# Patient Record
Sex: Female | Born: 1975 | Race: White | Hispanic: No | Marital: Single | Smoking: Current every day smoker
Health system: Southern US, Community
[De-identification: ages and names within clinical notes are randomized; demographics above are authoritative.]

## PROBLEM LIST (undated history)

## (undated) DIAGNOSIS — F319 Bipolar disorder, unspecified: Secondary | ICD-10-CM

## (undated) DIAGNOSIS — M1990 Unspecified osteoarthritis, unspecified site: Secondary | ICD-10-CM

## (undated) HISTORY — PX: BACK SURGERY: SHX140

---

## 2007-04-14 ENCOUNTER — Encounter
Admission: RE | Admit: 2007-04-14 | Discharge: 2007-04-16 | Payer: Self-pay | Admitting: Physical Medicine and Rehabilitation

## 2013-01-05 ENCOUNTER — Encounter (HOSPITAL_COMMUNITY): Payer: Self-pay | Admitting: Emergency Medicine

## 2013-01-05 ENCOUNTER — Emergency Department (HOSPITAL_COMMUNITY)
Admission: EM | Admit: 2013-01-05 | Discharge: 2013-01-05 | Disposition: A | Discharge status: Home / Self Care | Payer: Medicaid Other | Attending: Emergency Medicine | Admitting: Emergency Medicine

## 2013-01-05 DIAGNOSIS — Z9889 Other specified postprocedural states: Secondary | ICD-10-CM | POA: Insufficient documentation

## 2013-01-05 DIAGNOSIS — Z9181 History of falling: Secondary | ICD-10-CM | POA: Insufficient documentation

## 2013-01-05 DIAGNOSIS — F17200 Nicotine dependence, unspecified, uncomplicated: Secondary | ICD-10-CM | POA: Insufficient documentation

## 2013-01-05 DIAGNOSIS — K089 Disorder of teeth and supporting structures, unspecified: Secondary | ICD-10-CM | POA: Insufficient documentation

## 2013-01-05 DIAGNOSIS — M5380 Other specified dorsopathies, site unspecified: Secondary | ICD-10-CM | POA: Insufficient documentation

## 2013-01-05 DIAGNOSIS — M549 Dorsalgia, unspecified: Secondary | ICD-10-CM

## 2013-01-05 DIAGNOSIS — Z880 Allergy status to penicillin: Secondary | ICD-10-CM | POA: Insufficient documentation

## 2013-01-05 DIAGNOSIS — M545 Low back pain, unspecified: Secondary | ICD-10-CM | POA: Insufficient documentation

## 2013-01-05 DIAGNOSIS — R52 Pain, unspecified: Secondary | ICD-10-CM | POA: Insufficient documentation

## 2013-01-05 DIAGNOSIS — G8929 Other chronic pain: Secondary | ICD-10-CM | POA: Insufficient documentation

## 2013-01-05 DIAGNOSIS — K0889 Other specified disorders of teeth and supporting structures: Secondary | ICD-10-CM

## 2013-01-05 MED ORDER — CLINDAMYCIN HCL 150 MG PO CAPS: ORAL_CAPSULE | ORAL | Status: DC

## 2013-01-05 MED ORDER — DEXAMETHASONE 6 MG PO TABS: ORAL_TABLET | ORAL | Status: DC

## 2013-01-05 MED ORDER — METHOCARBAMOL 500 MG PO TABS: 500.0000 mg | ORAL_TABLET | Freq: Three times a day (TID) | ORAL | Status: DC

## 2013-01-05 MED ORDER — CLINDAMYCIN HCL 150 MG PO CAPS
300.0000 mg | ORAL_CAPSULE | Freq: Once | ORAL | Status: AC
  Administered 2013-01-05: 300 mg via ORAL
  Filled 2013-01-05: qty 2

## 2013-01-05 MED ORDER — METHOCARBAMOL 500 MG PO TABS
1000.0000 mg | ORAL_TABLET | Freq: Once | ORAL | Status: AC
  Administered 2013-01-05: 1000 mg via ORAL
  Filled 2013-01-05: qty 2

## 2013-01-05 MED ORDER — PROMETHAZINE HCL 12.5 MG PO TABS
12.5000 mg | ORAL_TABLET | Freq: Once | ORAL | Status: AC
  Administered 2013-01-05: 12.5 mg via ORAL
  Filled 2013-01-05: qty 1

## 2013-01-05 MED ORDER — HYDROCODONE-ACETAMINOPHEN 5-325 MG PO TABS: 1.0000 | ORAL_TABLET | ORAL | Status: DC | PRN

## 2013-01-05 MED ORDER — HYDROCODONE-ACETAMINOPHEN 5-325 MG PO TABS
2.0000 | ORAL_TABLET | Freq: Once | ORAL | Status: AC
  Administered 2013-01-05: 2 via ORAL
  Filled 2013-01-05: qty 2

## 2013-01-05 NOTE — ED Notes (Signed)
Hobson PA at bedside,  

## 2013-01-05 NOTE — ED Notes (Signed)
Pain assessed as noted in triage assessment.  Broken L bottom, last molar w/carrie.

## 2013-01-05 NOTE — ED Provider Notes (Signed)
History     CSN: [id number]  Arrival date & time 01/05/13  0945   First MD Initiated Contact with Patient 01/05/13 [phone]      Chief Complaint  Patient presents with  . Back Pain  . Dental Pain    (Consider location/radiation/quality/duration/timing/severity/associated sxs/prior treatment) HPI Comments: Pt states she has cavities of the upper and lower jaw. She had one to break recently and has had pain that is increasing for the last 3 days. She states she can not see a dentist for the next 2 weeks.  Patient is a 37 y.o. female presenting with back pain and tooth pain. The history is provided by the patient.  Back Pain Location:  Lumbar spine Quality:  Aching and cramping Radiates to:  L thigh Pain severity:  Severe Pain is:  Same all the time Onset quality:  Sudden Duration:  5 days Timing:  Constant Progression:  Worsening Chronicity: acute on chronic back pain. Context comment:  Hx of multiple operations involving the back. Larey Seat about 1 week ago. Can not see PCP for 2 weeks. Relieved by:  Nothing Worsened by:  Ambulation and palpation Ineffective treatments:  NSAIDs Associated symptoms: no abdominal pain, no bladder incontinence, no bowel incontinence, no chest pain, no dysuria and no perianal numbness   Dental Pain Associated symptoms: no neck pain     History reviewed. No pertinent past medical history.  Past Surgical History  Procedure Laterality Date  . Back surgery      No family history on file.  History  Substance Use Topics  . Smoking status: Current Every Day Smoker -- 1.00 packs/day    Types: Cigarettes  . Smokeless tobacco: Not on file  . Alcohol Use: No    OB History   Grav Para Term Preterm Abortions TAB SAB Ect Mult Living                  Review of Systems  Constitutional: Negative for activity change.       All ROS Neg except as noted in HPI  HENT: Positive for dental problem. Negative for nosebleeds and neck pain.   Eyes: Negative  for photophobia and discharge.  Respiratory: Negative for cough, shortness of breath and wheezing.   Cardiovascular: Negative for chest pain and palpitations.  Gastrointestinal: Negative for abdominal pain, blood in stool and bowel incontinence.  Genitourinary: Negative for bladder incontinence, dysuria, frequency and hematuria.  Musculoskeletal: Positive for back pain. Negative for arthralgias.  Skin: Negative.   Neurological: Negative for dizziness, seizures and speech difficulty.  Psychiatric/Behavioral: Negative for hallucinations and confusion.    Allergies  Penicillins  Home Medications   Current Outpatient Rx  Name  Route  Sig  Dispense  Refill  . benzocaine (ORAJEL) 10 % mucosal gel   Mouth/Throat   Use as directed 1 application in the mouth or throat as needed for pain.         . naproxen sodium (ALEVE) 220 MG tablet   Oral   Take 220 mg by mouth 3 (three) times daily as needed (back pain).           BP 139/88  Pulse 102  Temp(Src) 98.2 F (36.8 C)  Resp 18  Ht 5\' 4"  (1.626 m)  Wt 150 lb (68.04 kg)  BMI 25.73 kg/m2  SpO2 100%  LMP 01/05/2013  Physical Exam  Nursing note and vitals reviewed. Constitutional: She is oriented to person, place, and time. She appears well-developed and well-nourished.  Non-toxic appearance.  HENT:  Head: Normocephalic.  Right Ear: Tympanic membrane and external ear normal.  Left Ear: Tympanic membrane and external ear normal.  Eyes: EOM and lids are normal. Pupils are equal, round, and reactive to light.  Neck: Normal range of motion. Neck supple. Carotid bruit is not present.  Cardiovascular: Normal rate, regular rhythm, normal heart sounds, intact distal pulses and normal pulses.   Pulmonary/Chest: Breath sounds normal. No respiratory distress.  Abdominal: Soft. Bowel sounds are normal. There is no tenderness. There is no guarding.  Musculoskeletal:       Lumbar back: She exhibits decreased range of motion, tenderness and  spasm.  Lymphadenopathy:       Head (right side): No submandibular adenopathy present.       Head (left side): No submandibular adenopathy present.    She has no cervical adenopathy.  Neurological: She is alert and oriented to person, place, and time. She has normal strength. No cranial nerve deficit or sensory deficit.  Skin: Skin is warm and dry.  Psychiatric: She has a normal mood and affect. Her speech is normal.    ED Course  Procedures (including critical care time)  Labs Reviewed - No data to display No results found.   No diagnosis found.    MDM  I have reviewed nursing notes, vital signs, and all appropriate lab and imaging results for this patient. Ambulated pt in the hall. No foot drop. Exam reveals tenderness and spasm of the lower back area. No palpable step off. No hx of bowel or bladder  Problem. Plan - Rx for clindamycin, norco, decadron, and baclofen given to the patient. Pt strongly encouraged to see her dentist as soon as possible, and to see the back surgeon that performed the surg as soon as possible.       Kathie Dike, PA-C 01/05/13 1118

## 2013-01-05 NOTE — ED Notes (Signed)
Pt c/o back pain after falling while moving x 1 week ago. Pt also c/o right lower dental pain x 3 days from broken tooth.

## 2013-01-06 NOTE — ED Provider Notes (Signed)
Medical screening examination/treatment/procedure(s) were performed by non-physician practitioner and as supervising physician I was immediately available for consultation/collaboration.  Donnetta Hutching, MD 01/06/13 1343

## 2013-03-10 ENCOUNTER — Emergency Department (HOSPITAL_COMMUNITY): Payer: Medicaid Other

## 2013-03-10 ENCOUNTER — Encounter (HOSPITAL_COMMUNITY): Payer: Self-pay | Admitting: *Deleted

## 2013-03-10 ENCOUNTER — Emergency Department (HOSPITAL_COMMUNITY)
Admission: EM | Admit: 2013-03-10 | Discharge: 2013-03-10 | Disposition: A | Discharge status: Home / Self Care | Payer: Medicaid Other | Attending: Emergency Medicine | Admitting: Emergency Medicine

## 2013-03-10 DIAGNOSIS — Z8659 Personal history of other mental and behavioral disorders: Secondary | ICD-10-CM | POA: Insufficient documentation

## 2013-03-10 DIAGNOSIS — F17200 Nicotine dependence, unspecified, uncomplicated: Secondary | ICD-10-CM | POA: Insufficient documentation

## 2013-03-10 DIAGNOSIS — W108XXA Fall (on) (from) other stairs and steps, initial encounter: Secondary | ICD-10-CM | POA: Insufficient documentation

## 2013-03-10 DIAGNOSIS — Z3202 Encounter for pregnancy test, result negative: Secondary | ICD-10-CM | POA: Insufficient documentation

## 2013-03-10 DIAGNOSIS — R21 Rash and other nonspecific skin eruption: Secondary | ICD-10-CM | POA: Insufficient documentation

## 2013-03-10 DIAGNOSIS — Y9289 Other specified places as the place of occurrence of the external cause: Secondary | ICD-10-CM | POA: Insufficient documentation

## 2013-03-10 DIAGNOSIS — Z87828 Personal history of other (healed) physical injury and trauma: Secondary | ICD-10-CM | POA: Insufficient documentation

## 2013-03-10 DIAGNOSIS — W010XXA Fall on same level from slipping, tripping and stumbling without subsequent striking against object, initial encounter: Secondary | ICD-10-CM | POA: Insufficient documentation

## 2013-03-10 DIAGNOSIS — Y9301 Activity, walking, marching and hiking: Secondary | ICD-10-CM | POA: Insufficient documentation

## 2013-03-10 DIAGNOSIS — IMO0002 Reserved for concepts with insufficient information to code with codable children: Secondary | ICD-10-CM | POA: Insufficient documentation

## 2013-03-10 DIAGNOSIS — Z791 Long term (current) use of non-steroidal anti-inflammatories (NSAID): Secondary | ICD-10-CM | POA: Insufficient documentation

## 2013-03-10 DIAGNOSIS — Z880 Allergy status to penicillin: Secondary | ICD-10-CM | POA: Insufficient documentation

## 2013-03-10 DIAGNOSIS — T148XXA Other injury of unspecified body region, initial encounter: Secondary | ICD-10-CM

## 2013-03-10 DIAGNOSIS — G8929 Other chronic pain: Secondary | ICD-10-CM | POA: Insufficient documentation

## 2013-03-10 DIAGNOSIS — S20229A Contusion of unspecified back wall of thorax, initial encounter: Secondary | ICD-10-CM | POA: Insufficient documentation

## 2013-03-10 HISTORY — DX: Bipolar disorder, unspecified: F31.9

## 2013-03-10 LAB — URINALYSIS, ROUTINE W REFLEX MICROSCOPIC
Glucose, UA: 250 mg/dL — AB
Hgb urine dipstick: NEGATIVE
Protein, ur: 100 mg/dL — AB

## 2013-03-10 LAB — POCT PREGNANCY, URINE: Preg Test, Ur: NEGATIVE

## 2013-03-10 MED ORDER — DIAZEPAM 5 MG PO TABS
10.0000 mg | ORAL_TABLET | Freq: Once | ORAL | Status: AC
  Administered 2013-03-10: 10 mg via ORAL
  Filled 2013-03-10: qty 2

## 2013-03-10 MED ORDER — PREDNISONE 50 MG PO TABS
60.0000 mg | ORAL_TABLET | Freq: Once | ORAL | Status: AC
  Administered 2013-03-10: 60 mg via ORAL
  Filled 2013-03-10: qty 1

## 2013-03-10 MED ORDER — ONDANSETRON 4 MG PO TBDP
4.0000 mg | ORAL_TABLET | Freq: Once | ORAL | Status: AC
  Administered 2013-03-10: 4 mg via ORAL
  Filled 2013-03-10: qty 1

## 2013-03-10 MED ORDER — DEXAMETHASONE 6 MG PO TABS: ORAL_TABLET | ORAL | Status: DC

## 2013-03-10 MED ORDER — METHOCARBAMOL 500 MG PO TABS: ORAL_TABLET | ORAL | Status: DC

## 2013-03-10 MED ORDER — HYDROCODONE-ACETAMINOPHEN 7.5-325 MG PO TABS: 1.0000 | ORAL_TABLET | ORAL | Status: DC | PRN

## 2013-03-10 MED ORDER — HYDROCODONE-ACETAMINOPHEN 5-325 MG PO TABS
2.0000 | ORAL_TABLET | Freq: Once | ORAL | Status: AC
  Administered 2013-03-10: 2 via ORAL
  Filled 2013-03-10: qty 2

## 2013-03-10 NOTE — ED Notes (Signed)
Macrobid script called in for pt.

## 2013-03-10 NOTE — ED Provider Notes (Signed)
CSN: [id number]     Arrival date & time 03/10/13  1052 History     First MD Initiated Contact with Patient 03/10/13 1103     Chief Complaint  Patient presents with  . Fall  . Back Pain   (Consider location/radiation/quality/duration/timing/severity/associated sxs/prior Treatment) HPI Comments: PCP  Hessie Dibble, MD Pt sustained a fall while going down steps today. She has a hx of 4 back surgeries. She has chronic pain going down the left let, but states pain going down the right leg is new. Pain with standing up straight.  Patient is a 37 y.o. female presenting with fall and back pain. The history is provided by the patient.  Fall This is a new problem. The current episode started today. The problem has been gradually worsening. Associated symptoms include a rash. Pertinent negatives include no abdominal pain, arthralgias, chest pain, coughing, nausea, neck pain, numbness or vomiting. The symptoms are aggravated by standing and walking. She has tried NSAIDs for the symptoms. The treatment provided no relief.  Back Pain Associated symptoms: no abdominal pain, no chest pain, no dysuria and no numbness     Past Medical History  Diagnosis Date  . Bipolar 1 disorder    Past Surgical History  Procedure Laterality Date  . Back surgery     No family history on file. History  Substance Use Topics  . Smoking status: Current Every Day Smoker -- 1.00 packs/day    Types: Cigarettes  . Smokeless tobacco: Not on file  . Alcohol Use: No   OB History   Grav Para Term Preterm Abortions TAB SAB Ect Mult Living                 Review of Systems  Constitutional: Negative for activity change.       All ROS Neg except as noted in HPI  HENT: Negative for nosebleeds and neck pain.   Eyes: Negative for photophobia and discharge.  Respiratory: Negative for cough, shortness of breath and wheezing.   Cardiovascular: Negative for chest pain and palpitations.  Gastrointestinal: Negative for nausea,  vomiting, abdominal pain and blood in stool.  Genitourinary: Negative for dysuria, frequency and hematuria.  Musculoskeletal: Positive for back pain. Negative for arthralgias.  Skin: Positive for rash.  Neurological: Negative for dizziness, seizures, speech difficulty and numbness.  Psychiatric/Behavioral: Negative for hallucinations and confusion.    Allergies  Penicillins  Home Medications   Current Outpatient Rx  Name  Route  Sig  Dispense  Refill  . benzocaine (ORAJEL) 10 % mucosal gel   Mouth/Throat   Use as directed 1 application in the mouth or throat as needed for pain.         . clindamycin (CLEOCIN) 150 MG capsule      1 po tid with food   21 capsule   0   . dexamethasone (DECADRON) 6 MG tablet      1 po bid with food   12 tablet   0   . HYDROcodone-acetaminophen (NORCO/VICODIN) 5-325 MG per tablet   Oral   Take 1 tablet by mouth every 4 (four) hours as needed for pain.   20 tablet   0   . methocarbamol (ROBAXIN) 500 MG tablet   Oral   Take 1 tablet (500 mg total) by mouth 3 (three) times daily.   21 tablet   0   . naproxen sodium (ALEVE) 220 MG tablet   Oral   Take 220 mg by mouth 3 (three) times  daily as needed (back pain).          BP 123/55  Pulse 91  Temp(Src) 98.1 F (36.7 C) (Oral)  Resp 20  Ht 5\' 4"  (1.626 m)  Wt 147 lb (66.679 kg)  BMI 25.22 kg/m2  SpO2 98%  LMP 03/08/2013 Physical Exam  Nursing note and vitals reviewed. Constitutional: She is oriented to person, place, and time. She appears well-developed and well-nourished.  Non-toxic appearance.  HENT:  Head: Normocephalic.  Right Ear: Tympanic membrane and external ear normal.  Left Ear: Tympanic membrane and external ear normal.  Eyes: EOM and lids are normal. Pupils are equal, round, and reactive to light.  Neck: Normal range of motion. Neck supple. Carotid bruit is not present.  Cardiovascular: Normal rate, regular rhythm, normal heart sounds, intact distal pulses and  normal pulses.   Pulmonary/Chest: Breath sounds normal. No respiratory distress.  Abdominal: Soft. Bowel sounds are normal. There is no tenderness. There is no guarding.  Musculoskeletal: Normal range of motion.  Lymphadenopathy:       Head (right side): No submandibular adenopathy present.       Head (left side): No submandibular adenopathy present.    She has no cervical adenopathy.  Neurological: She is alert and oriented to person, place, and time. She has normal strength. No cranial nerve deficit or sensory deficit.  Skin: Skin is warm and dry.  Psychiatric: She has a normal mood and affect. Her speech is normal.    ED Course   Procedures (including critical care time)  Labs Reviewed  URINALYSIS, ROUTINE W REFLEX MICROSCOPIC - Abnormal; Notable for the following:    Color, Urine ORANGE (*)    Glucose, UA 250 (*)    Ketones, ur TRACE (*)    Protein, ur 100 (*)    Urobilinogen, UA >8.0 (*)    Nitrite POSITIVE (*)    All other components within normal limits  URINE MICROSCOPIC-ADD ON - Abnormal; Notable for the following:    Squamous Epithelial / LPF MANY (*)    Bacteria, UA MANY (*)    All other components within normal limits  POCT PREGNANCY, URINE   Dg Lumbar Spine Complete  03/10/2013   CLINICAL DATA:  37 year old female with fall and pain radiating down the right lower extremity. Prior back surgery.  EXAM: LUMBAR SPINE - COMPLETE 4+ VIEW  COMPARISON:  None.  FINDINGS: Normal lumbar segmentation. Sequelae of L5-S1 posterior and interbody fusion. Hardware appears intact. Vertebral height and alignment within normal limits. disc spaces elsewhere preserved. Bone mineralization is within normal limits. Sacral a ala and SI joints within normal limits. No pars fracture.  IMPRESSION: No acute fracture or listhesis identified in the lumbar spine. Previous L5-S1 posterior and interbody fusion.   Electronically Signed   By: Augusto Gamble   On: 03/10/2013 11:36   No diagnosis found.  MDM   **I have reviewed nursing notes, vital signs, and all appropriate lab and imaging results for this patient.* Xray of the L spine reveals the hardware from the previous back surg intact. No acute fracture noted. Plan - Rx for norco and decadron  And robaxin given. Pt to follow up with her PCP.  Kathie Dike, PA-C 03/16/13 [phone]

## 2013-03-10 NOTE — ED Notes (Signed)
Pt slipped on wet steps while walking up them this morning. Pain to lower back radiating down right leg. Pt states she has also been breaking out in a rash which began yesterday.

## 2013-03-17 NOTE — ED Provider Notes (Signed)
Medical screening examination/treatment/procedure(s) were performed by non-physician practitioner and as supervising physician I was immediately available for consultation/collaboration.   Cara Thaxton W. Brunella Wileman, MD 03/17/13 1728 

## 2013-04-01 ENCOUNTER — Encounter (HOSPITAL_COMMUNITY): Payer: Self-pay | Admitting: Emergency Medicine

## 2013-04-01 ENCOUNTER — Emergency Department (HOSPITAL_COMMUNITY): Payer: Medicaid Other

## 2013-04-01 ENCOUNTER — Emergency Department (HOSPITAL_COMMUNITY)
Admission: EM | Admit: 2013-04-01 | Discharge: 2013-04-01 | Disposition: A | Discharge status: Home / Self Care | Payer: Medicaid Other | Attending: Emergency Medicine | Admitting: Emergency Medicine

## 2013-04-01 DIAGNOSIS — Z79899 Other long term (current) drug therapy: Secondary | ICD-10-CM | POA: Insufficient documentation

## 2013-04-01 DIAGNOSIS — J40 Bronchitis, not specified as acute or chronic: Secondary | ICD-10-CM | POA: Insufficient documentation

## 2013-04-01 DIAGNOSIS — F17200 Nicotine dependence, unspecified, uncomplicated: Secondary | ICD-10-CM | POA: Insufficient documentation

## 2013-04-01 DIAGNOSIS — R21 Rash and other nonspecific skin eruption: Secondary | ICD-10-CM | POA: Insufficient documentation

## 2013-04-01 DIAGNOSIS — F319 Bipolar disorder, unspecified: Secondary | ICD-10-CM | POA: Insufficient documentation

## 2013-04-01 DIAGNOSIS — Z880 Allergy status to penicillin: Secondary | ICD-10-CM | POA: Insufficient documentation

## 2013-04-01 MED ORDER — ALBUTEROL SULFATE (5 MG/ML) 0.5% IN NEBU
5.0000 mg | INHALATION_SOLUTION | Freq: Once | RESPIRATORY_TRACT | Status: AC
  Administered 2013-04-01: 5 mg via RESPIRATORY_TRACT
  Filled 2013-04-01: qty 1

## 2013-04-01 MED ORDER — OXYCODONE-ACETAMINOPHEN 5-325 MG PO TABS: 2.0000 | ORAL_TABLET | ORAL | Status: DC | PRN

## 2013-04-01 MED ORDER — IPRATROPIUM BROMIDE 0.02 % IN SOLN
0.5000 mg | Freq: Once | RESPIRATORY_TRACT | Status: AC
  Administered 2013-04-01: 0.5 mg via RESPIRATORY_TRACT
  Filled 2013-04-01: qty 2.5

## 2013-04-01 MED ORDER — OXYCODONE-ACETAMINOPHEN 5-325 MG PO TABS
2.0000 | ORAL_TABLET | Freq: Once | ORAL | Status: AC
  Administered 2013-04-01: 2 via ORAL
  Filled 2013-04-01: qty 2

## 2013-04-01 MED ORDER — PREDNISONE 10 MG PO TABS: 20.0000 mg | ORAL_TABLET | Freq: Every day | ORAL | Status: DC

## 2013-04-01 MED ORDER — DIPHENHYDRAMINE HCL 25 MG PO CAPS
50.0000 mg | ORAL_CAPSULE | Freq: Once | ORAL | Status: AC
  Administered 2013-04-01: 50 mg via ORAL
  Filled 2013-04-01: qty 2

## 2013-04-01 MED ORDER — ALBUTEROL SULFATE HFA 108 (90 BASE) MCG/ACT IN AERS
2.0000 | INHALATION_SPRAY | RESPIRATORY_TRACT | Status: DC | PRN
  Filled 2013-04-01: qty 6.7

## 2013-04-01 MED ORDER — PREDNISONE 50 MG PO TABS
60.0000 mg | ORAL_TABLET | Freq: Once | ORAL | Status: AC
  Administered 2013-04-01: 60 mg via ORAL
  Filled 2013-04-01: qty 1

## 2013-04-01 NOTE — ED Notes (Signed)
Patient c/o rash x 2 weeks and cough/congestion since beginning of the week.

## 2013-04-01 NOTE — ED Notes (Signed)
Pt c/o rash that spreads throughout entire body. Pt first noticed rash approximately 1 month ago. Pt states irritation is the worst on her buttocks and vaginal area. Pt also c/o SOB and chest tightness. Pt has hx of asthma and states her home inhaler has not been effective.

## 2013-04-01 NOTE — ED Provider Notes (Signed)
CSN: [id number]     Arrival date & time 04/01/13  1941 History   First MD Initiated Contact with Patient 04/01/13 2013     Chief Complaint  Patient presents with  . Rash  . Cough   (Consider location/radiation/quality/duration/timing/severity/associated sxs/prior Treatment) Patient is a 37 y.o. female presenting with rash and cough. The history is provided by the patient.  Rash Location:  Full body Quality: itchiness and redness   Severity:  Severe Onset quality:  Gradual Timing:  Constant Progression:  Unchanged Context: not animal contact, not chemical exposure, not diapers, not eggs, not exposure to similar rash, not food, not hot tub use, not insect bite/sting, not medications, not new detergent/soap, not nuts, not plant contact and not sun exposure   Relieved by:  Nothing Worsened by:  Nothing tried Ineffective treatments:  Antihistamines Associated symptoms: no abdominal pain, no diarrhea, no fatigue, no fever, no headaches, no hoarse voice, no induration, no joint pain, no myalgias, no nausea, no periorbital edema, no shortness of breath, no sore throat, no throat swelling, no tongue swelling, no URI, not vomiting and not wheezing   Cough Associated symptoms: rash   Associated symptoms: no fever, no headaches, no myalgias, no shortness of breath, no sore throat and no wheezing    Patient with cough began today with some rhinorrhea.  Mild dyspnea. She is smoker but unable to smoke today.   Past Medical History  Diagnosis Date  . Bipolar 1 disorder    Past Surgical History  Procedure Laterality Date  . Back surgery     No family history on file. History  Substance Use Topics  . Smoking status: Current Every Day Smoker -- 1.00 packs/day    Types: Cigarettes  . Smokeless tobacco: Not on file  . Alcohol Use: No   OB History   Grav Para Term Preterm Abortions TAB SAB Ect Mult Living                 Review of Systems  Constitutional: Negative for fever and fatigue.   HENT: Negative for sore throat and hoarse voice.   Respiratory: Positive for cough. Negative for shortness of breath and wheezing.   Gastrointestinal: Negative for nausea, vomiting, abdominal pain and diarrhea.  Musculoskeletal: Negative for myalgias and arthralgias.  Skin: Positive for rash.  Neurological: Negative for headaches.  All other systems reviewed and are negative.    Allergies  Penicillins  Home Medications   Current Outpatient Rx  Name  Route  Sig  Dispense  Refill  . ALPRAZolam (XANAX) 0.5 MG tablet   Oral   Take 0.5 mg by mouth 3 (three) times daily.         . naproxen sodium (ALEVE) 220 MG tablet   Oral   Take 220 mg by mouth 3 (three) times daily as needed (back pain).         . QUEtiapine (SEROQUEL) 50 MG tablet   Oral   Take 50 mg by mouth at bedtime.         . sertraline (ZOLOFT) 50 MG tablet   Oral   Take 50 mg by mouth daily.          BP 106/75  Pulse 88  Temp(Src) 98.1 F (36.7 C) (Oral)  Resp 18  Ht 5\' 4"  (1.626 m)  Wt 150 lb (68.04 kg)  BMI 25.73 kg/m2  SpO2 99%  LMP 03/08/2013 Physical Exam  Nursing note and vitals reviewed. Constitutional: She is oriented to person, place, and  time. She appears well-developed and well-nourished.  HENT:  Head: Normocephalic and atraumatic.  Right Ear: External ear normal.  Left Ear: External ear normal.  Nose: Nose normal.  Mouth/Throat: Oropharynx is clear and moist.  Eyes: Conjunctivae and EOM are normal. Pupils are equal, round, and reactive to light.  Neck: Normal range of motion. Neck supple.  Cardiovascular: Normal rate and normal heart sounds.   Pulmonary/Chest: No respiratory distress. She has wheezes. She has no rales. She exhibits no tenderness.  Abdominal: Soft.  Musculoskeletal: Normal range of motion.  Neurological: She is alert and oriented to person, place, and time. She has normal reflexes.  Skin: Skin is warm. Rash noted.  Diffuse papular rash, scattered.     ED  Course  Procedures (including critical care time) Labs Review Labs Reviewed - No data to display Imaging Review Dg Chest 2 View  04/01/2013   *RADIOLOGY REPORT*  Clinical Data: Cough  CHEST - 2 VIEW  Comparison: None.  Findings: The heart and pulmonary vascularity are within normal limits.  The lungs are clear bilaterally.  A mild compression deformity is noted in the mid thoracic spine of uncertain age.  IMPRESSION: Thoracic compression deformity of uncertain age.  No other focal abnormality is noted.   Original Report Authenticated By: Alcide Clever, M.D.    MDM  No diagnosis found. 1-rash-j unclear etiology-does not appear consistent with infectious etiology, no definite triggers. Plan prednisone. 2-cough- patient with some wheezing,, resolved with albuterol.  Chest x-Katira Dumais without evidence of acute infection.  Plan inhaler and prednisone.  Smoking counseling provided.    Hilario Quarry, MD 04/01/13 2157

## 2014-10-13 ENCOUNTER — Emergency Department (HOSPITAL_COMMUNITY)
Admission: EM | Admit: 2014-10-13 | Discharge: 2014-10-13 | Disposition: A | Discharge status: Home / Self Care | Payer: Medicaid Other | Attending: Emergency Medicine | Admitting: Emergency Medicine

## 2014-10-13 ENCOUNTER — Encounter (HOSPITAL_COMMUNITY): Payer: Self-pay | Admitting: Emergency Medicine

## 2014-10-13 ENCOUNTER — Emergency Department (HOSPITAL_COMMUNITY): Payer: Medicaid Other

## 2014-10-13 DIAGNOSIS — S20229A Contusion of unspecified back wall of thorax, initial encounter: Secondary | ICD-10-CM | POA: Insufficient documentation

## 2014-10-13 DIAGNOSIS — Y998 Other external cause status: Secondary | ICD-10-CM | POA: Insufficient documentation

## 2014-10-13 DIAGNOSIS — Z880 Allergy status to penicillin: Secondary | ICD-10-CM | POA: Insufficient documentation

## 2014-10-13 DIAGNOSIS — Z79899 Other long term (current) drug therapy: Secondary | ICD-10-CM | POA: Diagnosis not present

## 2014-10-13 DIAGNOSIS — Y9389 Activity, other specified: Secondary | ICD-10-CM | POA: Diagnosis not present

## 2014-10-13 DIAGNOSIS — F319 Bipolar disorder, unspecified: Secondary | ICD-10-CM | POA: Diagnosis not present

## 2014-10-13 DIAGNOSIS — W108XXA Fall (on) (from) other stairs and steps, initial encounter: Secondary | ICD-10-CM | POA: Diagnosis not present

## 2014-10-13 DIAGNOSIS — W19XXXA Unspecified fall, initial encounter: Secondary | ICD-10-CM

## 2014-10-13 DIAGNOSIS — Z720 Tobacco use: Secondary | ICD-10-CM | POA: Insufficient documentation

## 2014-10-13 DIAGNOSIS — S7002XA Contusion of left hip, initial encounter: Secondary | ICD-10-CM | POA: Diagnosis not present

## 2014-10-13 DIAGNOSIS — M1990 Unspecified osteoarthritis, unspecified site: Secondary | ICD-10-CM | POA: Diagnosis not present

## 2014-10-13 DIAGNOSIS — S300XXA Contusion of lower back and pelvis, initial encounter: Secondary | ICD-10-CM

## 2014-10-13 DIAGNOSIS — Y9289 Other specified places as the place of occurrence of the external cause: Secondary | ICD-10-CM | POA: Insufficient documentation

## 2014-10-13 DIAGNOSIS — S3992XA Unspecified injury of lower back, initial encounter: Secondary | ICD-10-CM | POA: Diagnosis present

## 2014-10-13 HISTORY — DX: Unspecified osteoarthritis, unspecified site: M19.90

## 2014-10-13 MED ORDER — CYCLOBENZAPRINE HCL 10 MG PO TABS
10.0000 mg | ORAL_TABLET | Freq: Once | ORAL | Status: AC
  Administered 2014-10-13: 10 mg via ORAL
  Filled 2014-10-13: qty 1

## 2014-10-13 MED ORDER — NAPROXEN 500 MG PO TABS: 500.0000 mg | ORAL_TABLET | Freq: Two times a day (BID) | ORAL | Status: AC

## 2014-10-13 MED ORDER — CYCLOBENZAPRINE HCL 10 MG PO TABS: 10.0000 mg | ORAL_TABLET | Freq: Three times a day (TID) | ORAL | Status: AC | PRN

## 2014-10-13 MED ORDER — HYDROCODONE-ACETAMINOPHEN 5-325 MG PO TABS: ORAL_TABLET | ORAL | Status: AC

## 2014-10-13 MED ORDER — OXYCODONE-ACETAMINOPHEN 5-325 MG PO TABS
1.0000 | ORAL_TABLET | Freq: Once | ORAL | Status: AC
  Administered 2014-10-13: 1 via ORAL
  Filled 2014-10-13: qty 1

## 2014-10-13 NOTE — ED Notes (Addendum)
PT c/o chronic lower back pain agitated by a fall this evening with previous hx of lumbar surgeries. PT c/o sciatic nerve pain that radiates down her left leg.

## 2014-10-13 NOTE — ED Provider Notes (Signed)
CSN: [id number]     Arrival date & time 10/13/14  1548 History   First MD Initiated Contact with Patient 10/13/14 1606     Chief Complaint  Patient presents with  . Back Pain     (Consider location/radiation/quality/duration/timing/severity/associated sxs/prior Treatment) HPI   Nancy Valdez is a 39 y.o. female who presents to the Emergency Department complaining of left low back and hip pain that began sudden one hour PTA.  She states that she was carrying a basket of laundry down her steps and fell down approximately 10 steps.  She reports hx of 4 previous lumbar spine surgeries with the last one being 3 years ago.  She describes a sharp, radiating pain from her buttock down the back side of her leg down to the level of her calf.  Pain is worse with movement and weight bearing and improves at rest.  She denies numbness or weakness of the lower extremities, abd pain, urine or bowel changes, or other injuries. She states she contacted her PMD's office and was told to come here first.     Past Medical History  Diagnosis Date  . Bipolar 1 disorder   . DJD (degenerative joint disease)    Past Surgical History  Procedure Laterality Date  . Back surgery      x4   History reviewed. No pertinent family history. History  Substance Use Topics  . Smoking status: Current Every Day Smoker -- 1.00 packs/day    Types: Cigarettes  . Smokeless tobacco: Not on file  . Alcohol Use: No   OB History    Gravida Para Term Preterm AB TAB SAB Ectopic Multiple Living   Review of Systems  Constitutional: Negative for fever.  Respiratory: Negative for shortness of breath.   Gastrointestinal: Negative for vomiting, abdominal pain and constipation.  Genitourinary: Negative for dysuria, hematuria, flank pain, decreased urine volume and difficulty urinating.  Musculoskeletal: Positive for back pain and arthralgias (left hip pain). Negative for joint swelling.  Skin: Negative for rash.   Neurological: Negative for weakness and numbness.  All other systems reviewed and are negative.     Allergies  Penicillins  Home Medications   Prior to Admission medications   Medication Sig Start Date End Date Taking? Authorizing Provider  ALPRAZolam Prudy Feeler) 0.5 MG tablet Take 0.5 mg by mouth 3 (three) times daily.    Historical Provider, MD  naproxen sodium (ALEVE) 220 MG tablet Take 220 mg by mouth 3 (three) times daily as needed (back pain).    Historical Provider, MD  oxyCODONE-acetaminophen (PERCOCET/ROXICET) 5-325 MG per tablet Take 2 tablets by mouth every 4 (four) hours as needed for pain. 04/01/13   Margarita Grizzle, MD  predniSONE (DELTASONE) 10 MG tablet Take 2 tablets (20 mg total) by mouth daily. 04/01/13   Margarita Grizzle, MD  QUEtiapine (SEROQUEL) 50 MG tablet Take 50 mg by mouth at bedtime.    Historical Provider, MD  sertraline (ZOLOFT) 50 MG tablet Take 50 mg by mouth daily.    Historical Provider, MD   BP 119/81 mmHg  Pulse 104  Temp(Src) 98.6 F (37 C) (Oral)  Resp 18  Ht  (1.626 m)  Wt 115 lb (52.164 kg)  BMI 19.73 kg/m2  SpO2 100%  LMP 10/06/2014 Physical Exam  Constitutional: She is oriented to person, place, and time. She appears well-developed and well-nourished. No distress.  HENT:  Head: Normocephalic and atraumatic.  Neck: Normal range of motion. Neck supple.  Cardiovascular: Normal rate, regular rhythm, normal heart sounds and intact distal pulses.   No murmur heard. Pulmonary/Chest: Effort normal and breath sounds normal. No respiratory distress.  Abdominal: Soft. She exhibits no distension. There is no tenderness. There is no rebound and no guarding.  Musculoskeletal: She exhibits tenderness. She exhibits no edema.       Lumbar back: She exhibits tenderness and pain. She exhibits normal range of motion, no swelling, no deformity, no laceration and normal pulse.  ttp of the left lumbar spine and paraspinal muscles, including the SI joint.  DP  pulses are brisk and symmetrical.  Distal sensation intact.    Pt has 5/5 strength against resistance of bilateral lower extremities.     Neurological: She is alert and oriented to person, place, and time. She has normal strength. No sensory deficit. She exhibits normal muscle tone. Coordination and gait normal.  Reflex Scores:      Patellar reflexes are 2+ on the right side and 2+ on the left side.      Achilles reflexes are 2+ on the right side and 2+ on the left side. Skin: Skin is warm and dry. No rash noted.  No ecchymosis, abrasions or edema of the left hip or lumbar region  Nursing note and vitals reviewed.   ED Course  Procedures (including critical care time) Labs Review Labs Reviewed - No data to display  Imaging Review Dg Lumbar Spine Complete  10/13/2014   CLINICAL DATA:  Back pain with sciatic on the left  EXAM: LUMBAR SPINE - COMPLETE 4+ VIEW  COMPARISON:  03/10/2013  FINDINGS: L5-S1 posterior lumbar fusion with rod and pedicle screw fixation. There is no adverse finding or change. No progressive adjacent level degenerative change. No fracture or endplate erosion.  IMPRESSION: 1. No acute osseous findings or progressive degenerative change from 2014. 2. L5-S1 discectomy.   Electronically Signed   By: Marnee SpringJonathon  Watts M.D.   On: 10/13/2014 17:13   Dg Hip Unilat With Pelvis 2-3 Views Left  10/13/2014   CLINICAL DATA:  Chronic lower back pain. Larey SeatFell this evening with previous hx of lumbar surgeries. PT c/o sciatic nerve pain that radiates down left leg  EXAM: LEFT HIP (WITH PELVIS) 2-3 VIEWS  COMPARISON:  None.  FINDINGS: No acute fracture or dislocation. No lytic or sclerotic osseous lesion. 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  IMPRESSION: No acute osseous injury of the left hip.   Electronically Signed   By: Elige KoHetal  Patel   On: 10/13/2014 17:16       EKG Interpretation None      MDM   Final diagnoses:  Fall  Lumbar contusion, initial encounter  Contusion, hip, left, initial encounter    Pt reviewed on the [place] narcotic database.  No recent opioids filed.  No focal neuro deficits.  XR's neg for fx.  Pt agrees to symptomatic tx and close ortho f/u.     Severiano Gilbertammi Lashonda Sonneborn, PA-C 10/15/14 2146  Donnetta HutchingBrian Cook, MD 10/15/14 [phone]
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
## 2015-05-12 IMAGING — DX DG HIP (WITH OR WITHOUT PELVIS) 2-3V*L*
3 series · 3 of 3 positions shown · non-contrast
Comparison: None.

CLINICAL DATA: Chronic lower back pain. Fell this evening with
previous hx of lumbar surgeries. PT c/o sciatic nerve pain that
radiates down left leg

EXAM:
LEFT HIP (WITH PELVIS) 2-3 VIEWS

[pelvis ap]
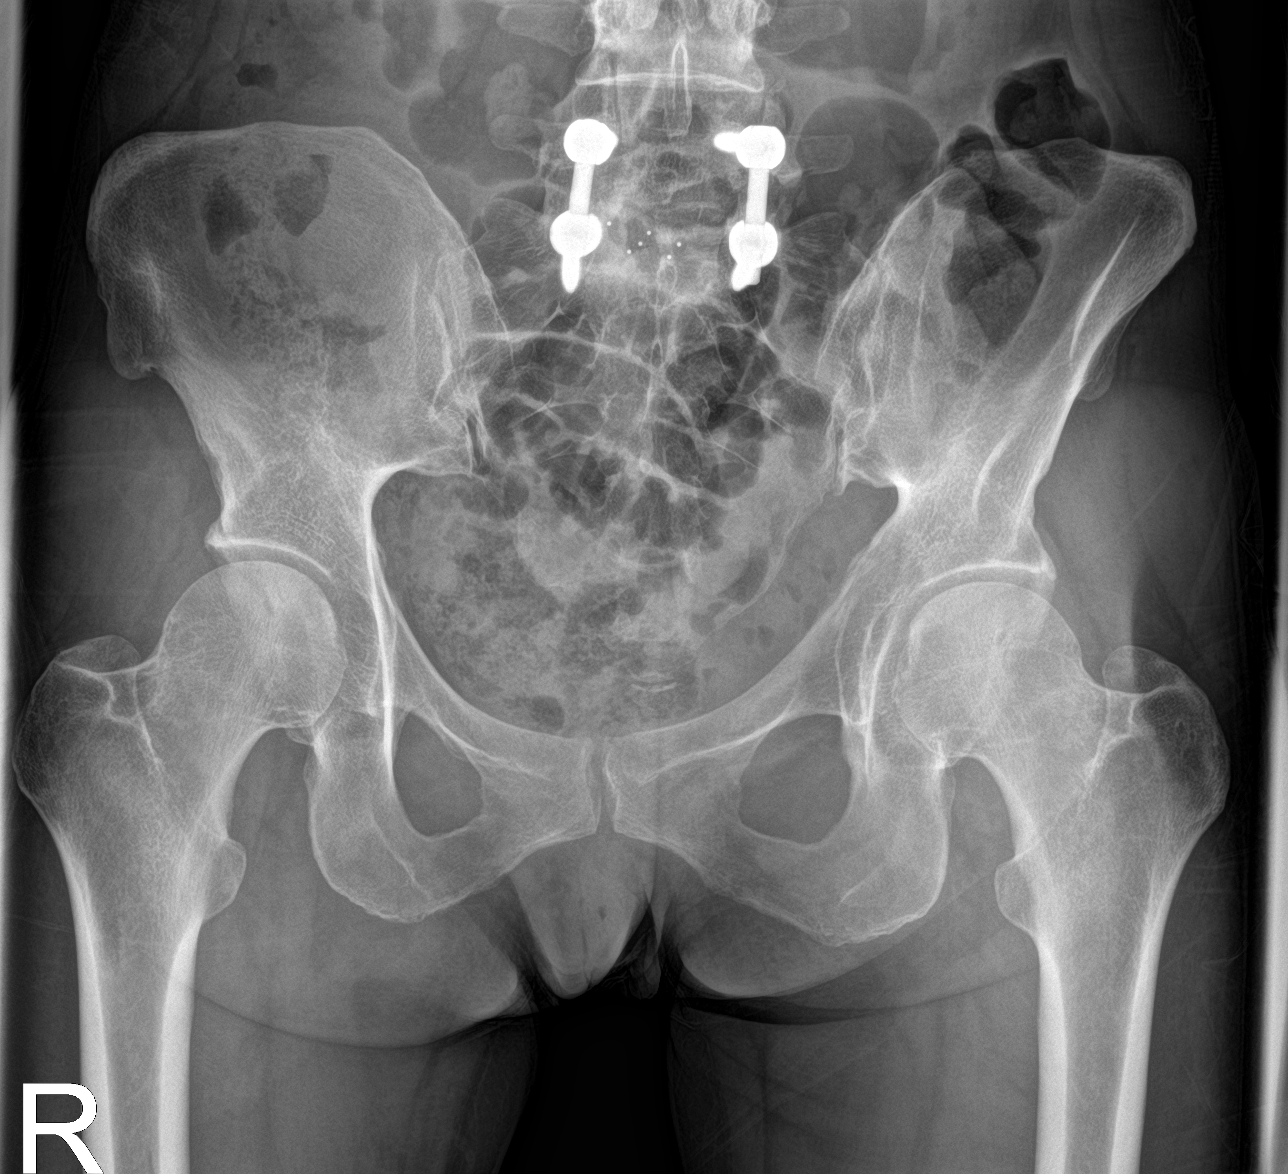

[hip ap]
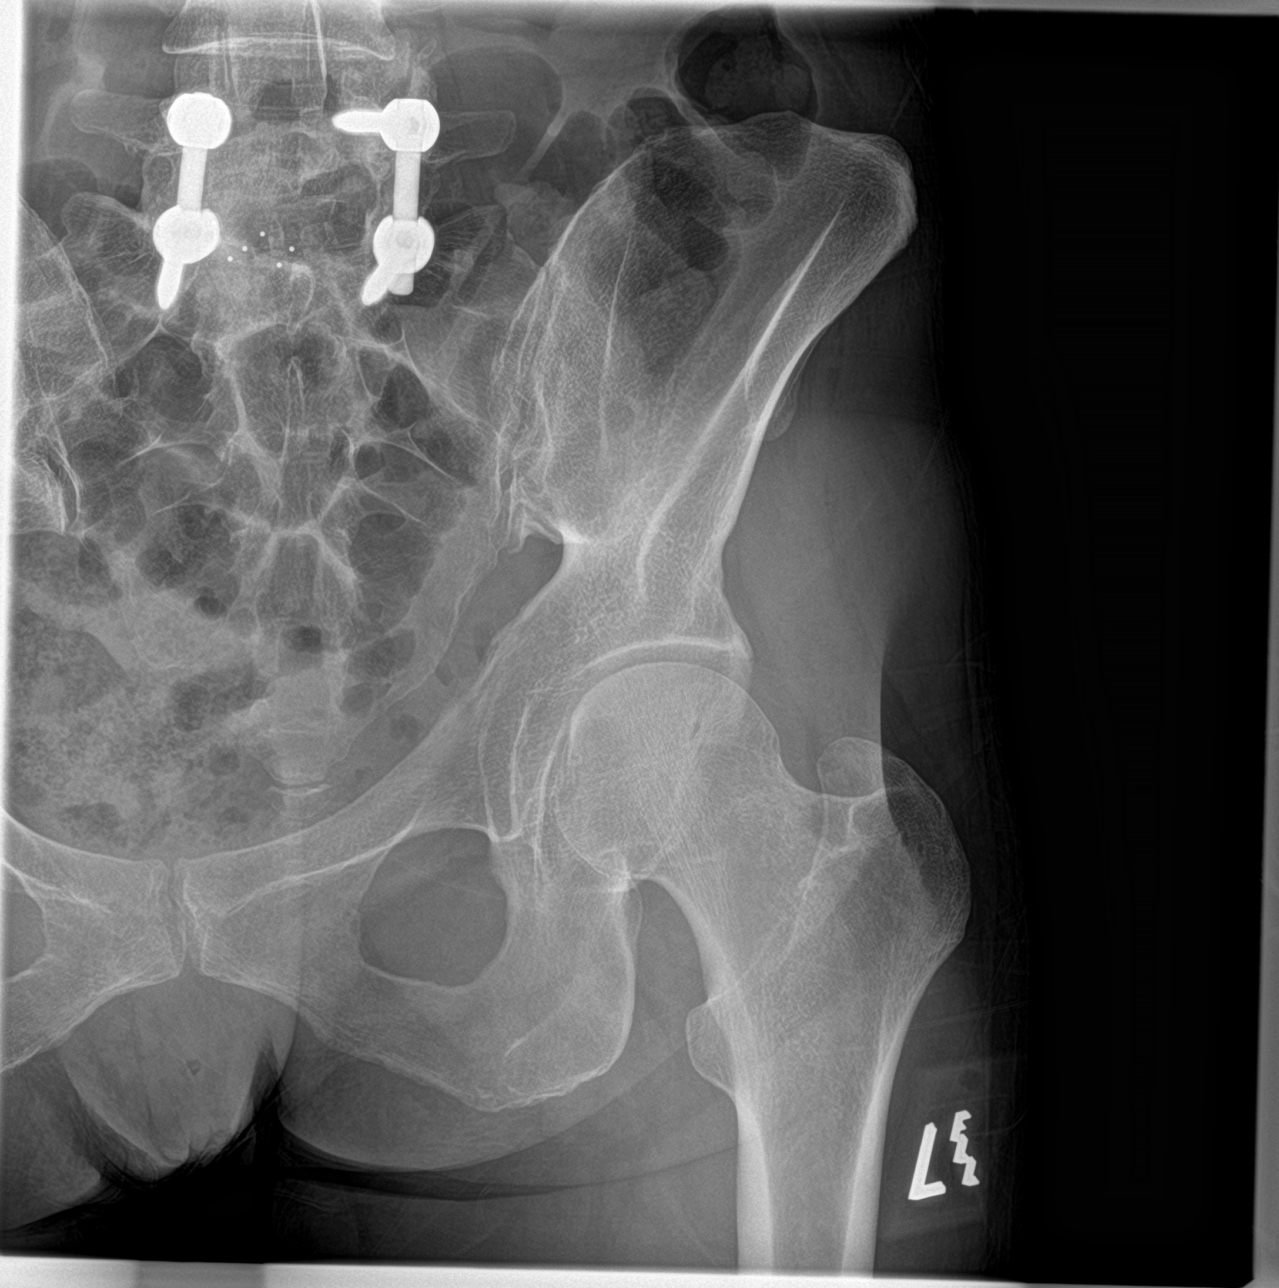

[hip lat]
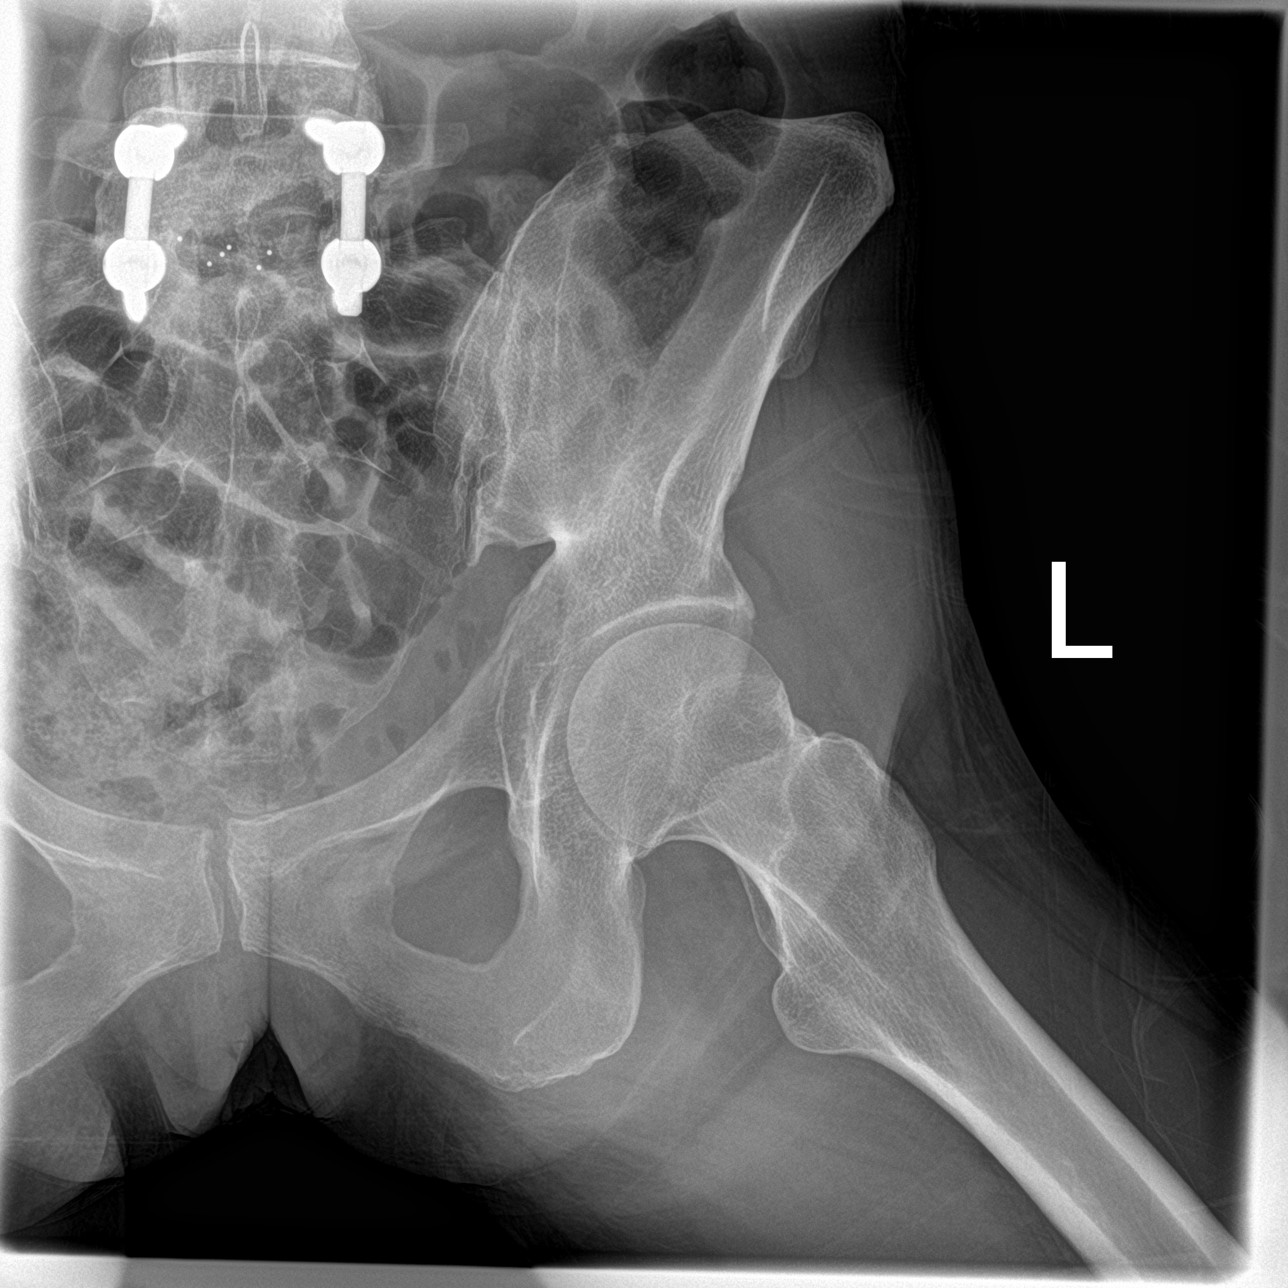

[3 of 3 positions shown; findings below may reference images not displayed]

FINDINGS: No acute fracture or dislocation. No lytic or sclerotic osseous
lesion. Osseous prominence along the superior femoral head neck
junction as can be seen with CAM type femoroacetabular impingement.
Posterior spinal fusion at L5-S1 with interbody spacer.
IMPRESSION: No acute osseous injury of the left hip.
